# Patient Record
Sex: Female | Born: 1970 | Race: White | Hispanic: No | Smoking: Never smoker
Health system: Southern US, Community
[De-identification: ages and names within clinical notes are randomized; demographics above are authoritative.]

## PROBLEM LIST (undated history)

## (undated) DIAGNOSIS — J45909 Unspecified asthma, uncomplicated: Secondary | ICD-10-CM

## (undated) HISTORY — PX: ABDOMINAL HYSTERECTOMY: SHX81

## (undated) HISTORY — PX: SHOULDER SURGERY: SHX246

## (undated) HISTORY — PX: KNEE SURGERY: SHX244

---

## 2009-02-21 ENCOUNTER — Encounter: Admission: RE | Admit: 2009-02-21 | Discharge: 2009-02-21 | Payer: Self-pay | Admitting: Internal Medicine

## 2010-02-02 IMAGING — CR DG CHEST 2V
2 series · 2 of 2 positions shown · non-contrast
Comparison: None

CLINICAL DATA: Wheezing, chest heaviness, cough

CHEST - 2 VIEW

[view not recorded (1 of 2)]
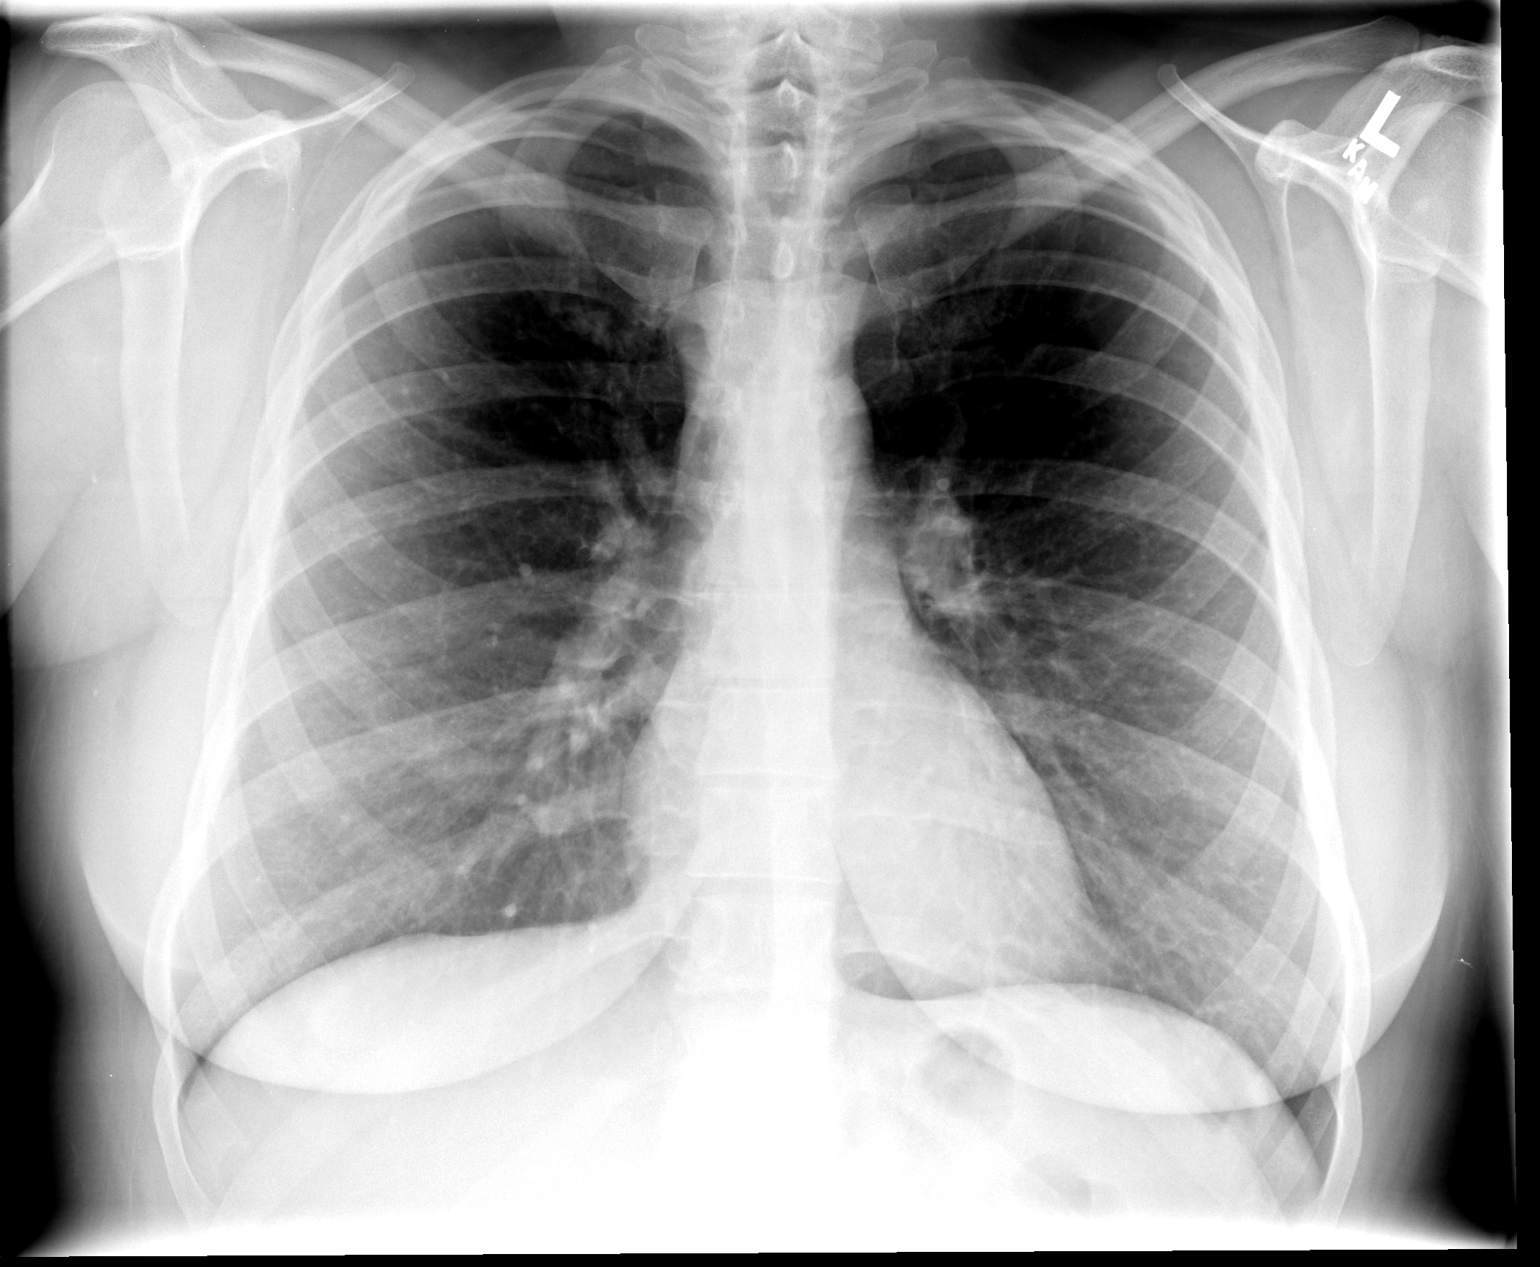

[view not recorded (2 of 2)]
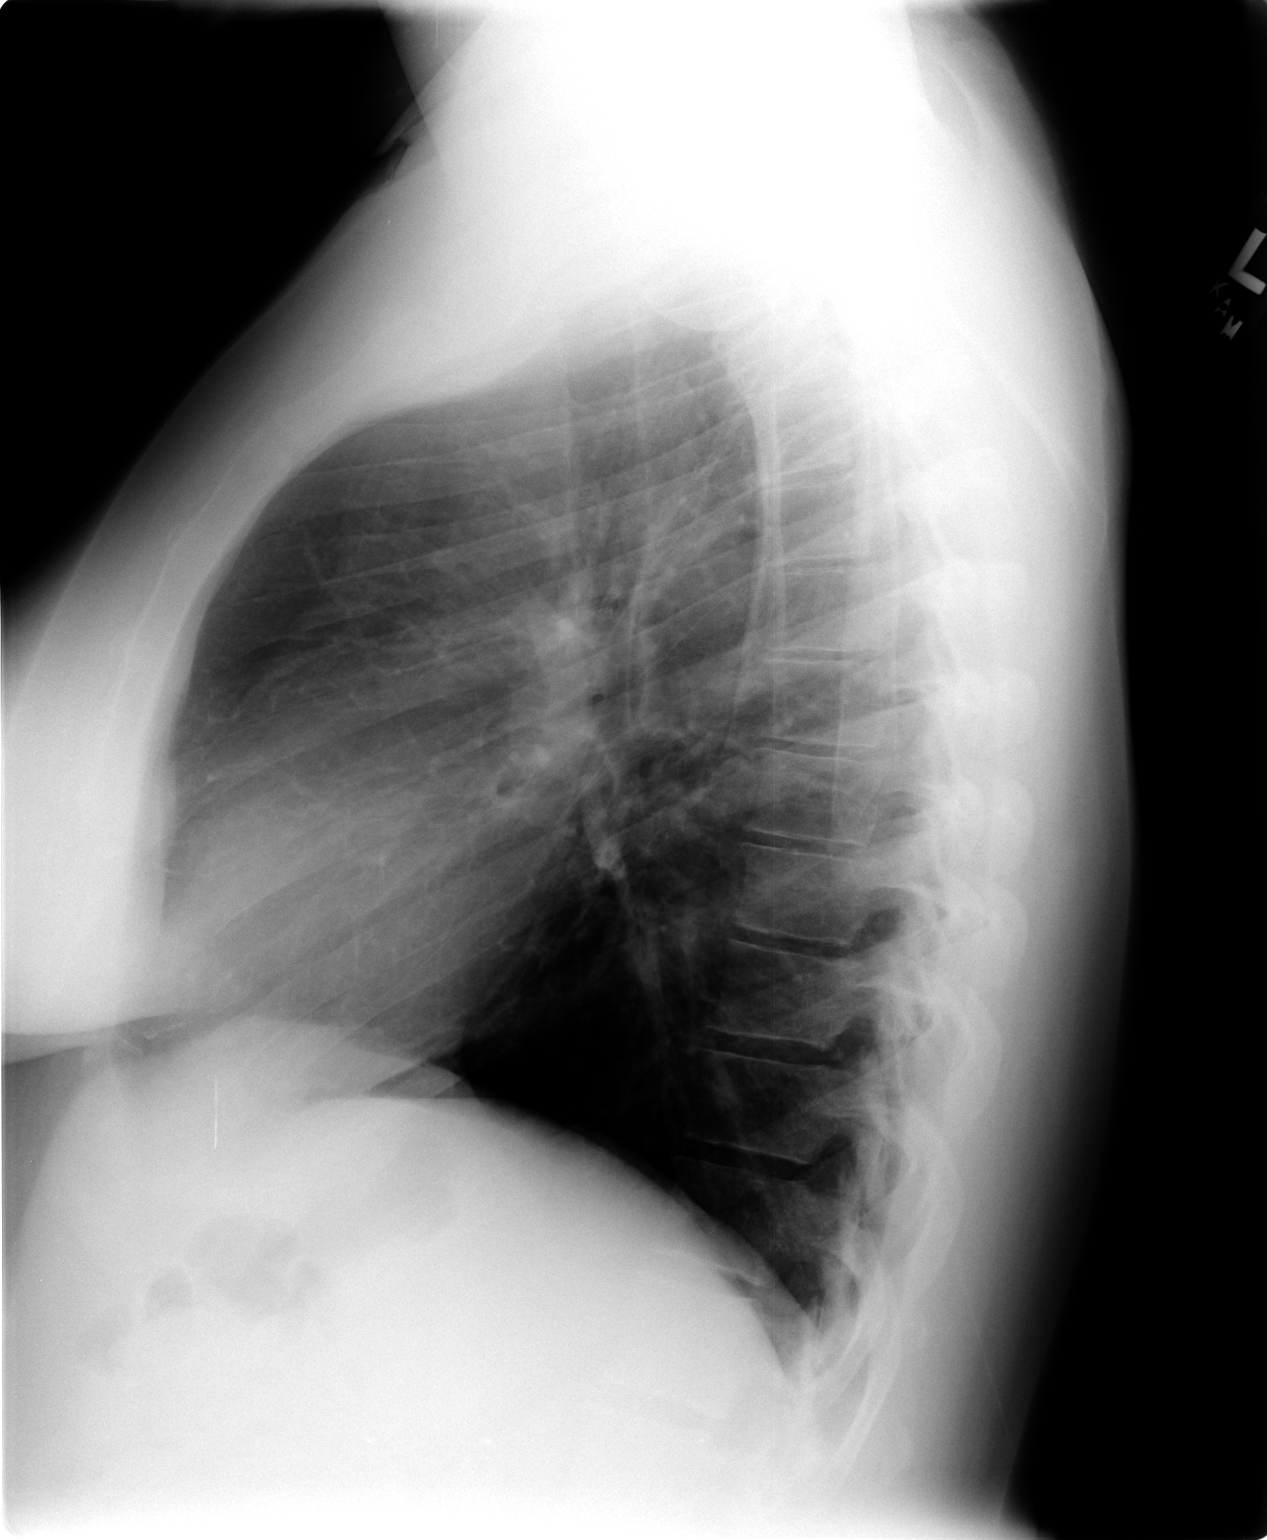

[2 of 2 positions shown; findings below may reference images not displayed]

FINDINGS: The lungs are clear. There is mild peribronchial
thickening present. The heart is within normal limits in size. No
bony abnormality is seen.
IMPRESSION: No active lung disease. Mild peribronchial thickening.

## 2018-12-17 ENCOUNTER — Encounter (HOSPITAL_BASED_OUTPATIENT_CLINIC_OR_DEPARTMENT_OTHER): Payer: Self-pay | Admitting: *Deleted

## 2018-12-17 ENCOUNTER — Emergency Department (HOSPITAL_BASED_OUTPATIENT_CLINIC_OR_DEPARTMENT_OTHER)
Admission: EM | Admit: 2018-12-17 | Discharge: 2018-12-18 | Disposition: A | Discharge status: Home / Self Care | Payer: BC Managed Care – PPO | Attending: Emergency Medicine | Admitting: Emergency Medicine

## 2018-12-17 ENCOUNTER — Other Ambulatory Visit: Payer: Self-pay

## 2018-12-17 DIAGNOSIS — R1013 Epigastric pain: Secondary | ICD-10-CM | POA: Diagnosis not present

## 2018-12-17 DIAGNOSIS — J45909 Unspecified asthma, uncomplicated: Secondary | ICD-10-CM | POA: Insufficient documentation

## 2018-12-17 HISTORY — DX: Unspecified asthma, uncomplicated: J45.909

## 2018-12-17 LAB — COMPREHENSIVE METABOLIC PANEL
ALT: 13 U/L (ref 0–44)
AST: 15 U/L (ref 15–41)
Albumin: 3.7 g/dL (ref 3.5–5.0)
Alkaline Phosphatase: 78 U/L (ref 38–126)
Anion gap: 8 (ref 5–15)
BUN: 19 mg/dL (ref 6–20)
CO2: 20 mmol/L — ABNORMAL LOW (ref 22–32)
Calcium: 8.8 mg/dL — ABNORMAL LOW (ref 8.9–10.3)
Chloride: 107 mmol/L (ref 98–111)
Creatinine, Ser: 0.88 mg/dL (ref 0.44–1.00)
GFR calc Af Amer: >60 mL/min (ref >60)
GFR calc non Af Amer: >60 mL/min (ref >60)
Glucose, Bld: 131 mg/dL — ABNORMAL HIGH (ref 70–99)
Potassium: 3.5 mmol/L (ref 3.5–5.1)
Sodium: 135 mmol/L (ref 135–145)
Total Bilirubin: 0.3 mg/dL (ref 0.3–1.2)
Total Protein: 6.9 g/dL (ref 6.5–8.1)

## 2018-12-17 LAB — CBC WITH DIFFERENTIAL/PLATELET
Abs Immature Granulocytes: 0.03 10*3/uL (ref 0.00–0.07)
Basophils Absolute: 0.1 10*3/uL (ref 0.0–0.1)
Basophils Relative: 1 %
Eosinophils Absolute: 0.3 10*3/uL (ref 0.0–0.5)
Eosinophils Relative: 3 %
HCT: 40 % (ref 36.0–46.0)
Hemoglobin: 12.8 g/dL (ref 12.0–15.0)
Immature Granulocytes: 0 %
Lymphocytes Relative: 26 %
Lymphs Abs: 3 10*3/uL (ref 0.7–4.0)
MCH: 24.9 pg — ABNORMAL LOW (ref 26.0–34.0)
MCHC: 32 g/dL (ref 30.0–36.0)
MCV: 77.7 fL — ABNORMAL LOW (ref 80.0–100.0)
Monocytes Absolute: 0.8 10*3/uL (ref 0.1–1.0)
Monocytes Relative: 7 %
Neutro Abs: 7.2 10*3/uL (ref 1.7–7.7)
Neutrophils Relative %: 63 %
Platelets: 312 10*3/uL (ref 150–400)
RBC: 5.15 MIL/uL — ABNORMAL HIGH (ref 3.87–5.11)
RDW: 15.1 % (ref 11.5–15.5)
WBC: 11.4 10*3/uL — ABNORMAL HIGH (ref 4.0–10.5)
nRBC: 0 % (ref 0.0–0.2)

## 2018-12-17 LAB — LIPASE, BLOOD: Lipase: 50 U/L (ref 11–51)

## 2018-12-17 MED ORDER — MORPHINE SULFATE (PF) 4 MG/ML IV SOLN
4.0000 mg | Freq: Once | INTRAVENOUS | Status: AC
  Administered 2018-12-17: 4 mg via INTRAVENOUS
  Filled 2018-12-17: qty 1

## 2018-12-17 MED ORDER — SODIUM CHLORIDE 0.9 % IV BOLUS
1000.0000 mL | Freq: Once | INTRAVENOUS | Status: AC
  Administered 2018-12-17: 1000 mL via INTRAVENOUS

## 2018-12-17 MED ORDER — ONDANSETRON HCL 4 MG/2ML IJ SOLN
4.0000 mg | Freq: Once | INTRAMUSCULAR | Status: AC
  Administered 2018-12-17: 4 mg via INTRAVENOUS
  Filled 2018-12-17: qty 2

## 2018-12-17 NOTE — ED Notes (Signed)
ED Provider at bedside. 

## 2018-12-17 NOTE — ED Notes (Signed)
Denies any urinary symptoms.

## 2018-12-17 NOTE — ED Triage Notes (Signed)
Pt reports dull "crazy" upper abdominal pain since 5pm. She took pain medicine pta. Denies n/v

## 2018-12-18 LAB — URINALYSIS, ROUTINE W REFLEX MICROSCOPIC
Bilirubin Urine: NEGATIVE
Glucose, UA: NEGATIVE mg/dL
Hgb urine dipstick: NEGATIVE
Ketones, ur: NEGATIVE mg/dL
Leukocytes,Ua: NEGATIVE
Nitrite: NEGATIVE
Protein, ur: NEGATIVE mg/dL
Specific Gravity, Urine: 1.02 (ref 1.005–1.030)
pH: 7 (ref 5.0–8.0)

## 2018-12-18 MED ORDER — ONDANSETRON 4 MG PO TBDP: 4.0000 mg | ORAL_TABLET | Freq: Three times a day (TID) | ORAL | 0 refills | Status: DC | PRN

## 2018-12-18 MED ORDER — OMEPRAZOLE 20 MG PO CPDR: 20.0000 mg | DELAYED_RELEASE_CAPSULE | Freq: Every day | ORAL | 0 refills | Status: AC

## 2018-12-18 NOTE — Discharge Instructions (Addendum)
You were seen today for upper abdominal pain.  Her story is highly suspicious for gallbladder disease.  Avoid fatty foods.  Return for an ultrasound.  Follow-up with general surgery if you have recurrent pain.  If pain returns and persists or if you develop fevers, nausea, vomiting, any new or worsening symptoms you should be reevaluated.

## 2018-12-18 NOTE — ED Provider Notes (Signed)
[facility] EMERGENCY DEPARTMENT Provider Note   CSN: [id number] Arrival date & time: 12/17/18  2255    History   Chief Complaint Chief Complaint  Patient presents with  . Abdominal Pain    HPI Miranda Martin is a 48 y.o. female.     HPI  This is a 48 year old female with no significant past medical history who presents with epigastric pain.  Patient reports onset of symptoms after eating at approximately 8 PM.  She reports epigastric pain that radiates upward.  She states that it is dull.  She took 2 oxycodone with no relief.  Currently her pain is 9 out of 10.  She denies any nausea, vomiting, diarrhea.  She denies any fevers or urinary symptoms.  Of note, patient reports over the last month that she has noted similar pain after eating specifically fatty foods.  She states that she ate tacos for dinner.  Past Medical History:  Diagnosis Date  . Asthma     There are no active problems to display for this patient.   Past Surgical History:  Procedure Laterality Date  . ABDOMINAL HYSTERECTOMY    . KNEE SURGERY    . SHOULDER SURGERY       OB History   No obstetric history on file.      Home Medications    Prior to Admission medications   Medication Sig Start Date End Date Taking? Authorizing Provider  omeprazole (PRILOSEC) 20 MG capsule Take 1 capsule (20 mg total) by mouth daily. 12/18/18   Aliene Tamura, Mayer Masker, MD  ondansetron (ZOFRAN ODT) 4 MG disintegrating tablet Take 1 tablet (4 mg total) by mouth every 8 (eight) hours as needed. 12/18/18   Leeba Barbe, Mayer Masker, MD    Family History No family history on file.  Social History Social History   Tobacco Use  . Smoking status: Never Smoker  . Smokeless tobacco: Never Used  Substance Use Topics  . Alcohol use: Yes    Comment: occasional  . Drug use: Never     Allergies   Penicillins   Review of Systems Review of Systems  Constitutional: Negative for fever.  Respiratory: Negative for  shortness of breath.   Cardiovascular: Negative for chest pain.  Gastrointestinal: Positive for abdominal pain. Negative for diarrhea, nausea and vomiting.  Genitourinary: Negative for dysuria.  All other systems reviewed and are negative.    Physical Exam Updated Vital Signs BP (!) 136/111 (BP Location: Right Arm)   Pulse 98   Temp 98.1 F (36.7 C) (Oral)   Resp 18   Ht 1.778 m (5\' 10" )   Wt 97.5 kg   SpO2 100%   BMI 30.85 kg/m   Physical Exam Vitals signs and nursing note reviewed.  Constitutional:      Appearance: She is well-developed.  HENT:     Head: Normocephalic and atraumatic.  Neck:     Musculoskeletal: Neck supple.  Cardiovascular:     Rate and Rhythm: Normal rate and regular rhythm.  Pulmonary:     Effort: Pulmonary effort is normal. No respiratory distress.  Abdominal:     General: Bowel sounds are normal.     Palpations: Abdomen is soft.     Tenderness: There is abdominal tenderness in the right upper quadrant and epigastric area. There is no guarding or rebound. Negative signs include Murphy's sign.  Skin:    General: Skin is warm and dry.  Neurological:     Mental Status: She is alert and oriented  to person, place, and time.      ED Treatments / Results  Labs (all labs ordered are listed, but only abnormal results are displayed) Labs Reviewed  CBC WITH DIFFERENTIAL/PLATELET - Abnormal; Notable for the following components:      Result Value   WBC 11.4 (*)    RBC 5.15 (*)    MCV 77.7 (*)    MCH 24.9 (*)    All other components within normal limits  COMPREHENSIVE METABOLIC PANEL - Abnormal; Notable for the following components:   CO2 20 (*)    Glucose, Bld 131 (*)    Calcium 8.8 (*)    All other components within normal limits  LIPASE, BLOOD  URINALYSIS, ROUTINE W REFLEX MICROSCOPIC    EKG None  Radiology No results found.  Procedures Procedures (including critical care time)  Medications Ordered in ED Medications  morphine 4  MG/ML injection 4 mg (4 mg Intravenous Given 12/17/18 2347)  ondansetron (ZOFRAN) injection 4 mg (4 mg Intravenous Given 12/17/18 2347)  sodium chloride 0.9 % bolus 1,000 mL (0 mLs Intravenous Stopped 12/18/18 0051)     Initial Impression / Assessment and Plan / ED Course  I have reviewed the triage vital signs and the nursing notes.  Pertinent labs & imaging results that were available during my care of the patient were reviewed by me and considered in my medical decision making (see chart for details).       Patient presents with epigastric pain.  Pain is highly suspicious for gallbladder pathology.  Reflux, pancreatitis is also a consideration.  She is overall nontoxic-appearing on exam.  No signs of peritonitis.  Patient given pain and nausea medication.  Lab work obtained.  Lipase is normal.  LFTs are within normal range.  White count is 11.4 which is marginally elevated.  No left shift.  On recheck, patient is comfortable and states she feels much better.  Discussed with her the lab work which is reassuring at this time.  Since she is feeling better, I do not feel that she needs emergent imaging but would likely benefit from outpatient ultrasound to evaluate her gallbladder.  Patient is able to tolerate fluids without recurrent pain.  She is agreeable to plan to return tomorrow for evaluation for her gallbladder.  Given her reassuring exam and lab work, have low suspicion for cholecystitis.  Suspect biliary colic.  After history, exam, and medical workup I feel the patient has been appropriately medically screened and is safe for discharge home. Pertinent diagnoses were discussed with the patient. Patient was given return precautions.  Final Clinical Impressions(s) / ED Diagnoses   Final diagnoses:  Epigastric pain    ED Discharge Orders         Ordered    ondansetron (ZOFRAN ODT) 4 MG disintegrating tablet  Every 8 hours PRN     12/18/18 0047    omeprazole (PRILOSEC) 20 MG capsule   Daily     12/18/18 0047    US Abdomen Limited RUQ/Gall Bladder     12/18/18 0048           Shon BatonHorton, Aerie Donica F, MD 12/18/18 [id number]

## 2018-12-18 NOTE — ED Notes (Signed)
Pt was given a Coke for PO challenge

## 2018-12-18 NOTE — ED Notes (Signed)
Pt was able to tolerate Coke. EDP notified. Will continue to monitor

## 2018-12-18 NOTE — ED Notes (Signed)
ED Provider at bedside. 

## 2020-05-15 ENCOUNTER — Other Ambulatory Visit: Payer: Self-pay

## 2020-05-15 ENCOUNTER — Encounter: Payer: Self-pay | Admitting: Physician Assistant

## 2020-05-15 ENCOUNTER — Ambulatory Visit: Payer: BC Managed Care – PPO | Admitting: Physician Assistant

## 2020-05-15 DIAGNOSIS — L821 Other seborrheic keratosis: Secondary | ICD-10-CM

## 2020-05-15 DIAGNOSIS — B009 Herpesviral infection, unspecified: Secondary | ICD-10-CM | POA: Diagnosis not present

## 2020-05-15 DIAGNOSIS — L700 Acne vulgaris: Secondary | ICD-10-CM | POA: Diagnosis not present

## 2020-05-15 MED ORDER — VALACYCLOVIR HCL 500 MG PO TABS: 500.0000 mg | ORAL_TABLET | Freq: Every day | ORAL | 1 refills | Status: DC

## 2020-05-15 MED ORDER — ALCLOMETASONE DIPROPIONATE 0.05 % EX CREA: TOPICAL_CREAM | Freq: Every day | CUTANEOUS | 3 refills | Status: AC

## 2020-05-21 NOTE — Progress Notes (Signed)
   Follow-Up Visit   Subjective  Miranda Martin is a 49 y.o. female who presents for the following: Establish Care (re-est care) and Skin Problem (spots that on face for 1.5 years but that now went away--check moles on back--spots on left leg possible wart).   The following portions of the chart were reviewed this encounter and updated as appropriate: Tobacco  Allergies  Meds  Problems  Med Hx  Surg Hx  Fam Hx      Objective  Well appearing patient in no apparent distress; mood and affect are within normal limits.  A full examination was performed including scalp, head, eyes, ears, nose, lips, neck, chest, axillae, abdomen, back, buttocks, bilateral upper extremities, bilateral lower extremities, hands, feet, fingers, toes, fingernails, and toenails. All findings within normal limits unless otherwise noted below.  Objective  Left Thigh - Anterior, Neck - Anterior, Right Lower Leg - Anterior (2): Stuck-on, waxy papules and plaques.   Objective  Chin, Left side of nose: Erythematous papules and excoriations   Objective  Left Upper Vermilion Lip, Right Upper Vermilion Lip: Clear today. 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  The documentation on 05/21/20 for the exam, diagnosis, procedures and orders are all accurate and complete.

## 2020-10-13 ENCOUNTER — Other Ambulatory Visit: Payer: Self-pay | Admitting: Physician Assistant

## 2021-03-28 ENCOUNTER — Other Ambulatory Visit: Payer: Self-pay | Admitting: Physician Assistant

## 2021-03-28 DIAGNOSIS — B009 Herpesviral infection, unspecified: Secondary | ICD-10-CM

## 2021-04-13 ENCOUNTER — Other Ambulatory Visit: Payer: Self-pay | Admitting: Physician Assistant

## 2021-04-13 DIAGNOSIS — B009 Herpesviral infection, unspecified: Secondary | ICD-10-CM

## 2021-11-10 ENCOUNTER — Other Ambulatory Visit: Payer: Self-pay | Admitting: Physician Assistant
